# Patient Record
Sex: Male | Born: 1970 | Race: Black or African American | Hispanic: No | Marital: Married | Smoking: Never smoker
Health system: Southern US, Community
[De-identification: ages and names within clinical notes are randomized; demographics above are authoritative.]

## PROBLEM LIST (undated history)

## (undated) DIAGNOSIS — I10 Essential (primary) hypertension: Secondary | ICD-10-CM

---

## 2000-03-19 ENCOUNTER — Encounter: Payer: Self-pay | Admitting: Emergency Medicine

## 2000-03-19 ENCOUNTER — Emergency Department (HOSPITAL_COMMUNITY): Admission: EM | Admit: 2000-03-19 | Discharge: 2000-03-19 | Payer: Self-pay | Admitting: Emergency Medicine

## 2000-04-11 ENCOUNTER — Emergency Department (HOSPITAL_COMMUNITY): Admission: EM | Admit: 2000-04-11 | Discharge: 2000-04-11 | Payer: Self-pay | Admitting: Emergency Medicine

## 2005-11-26 ENCOUNTER — Emergency Department (HOSPITAL_COMMUNITY): Admission: EM | Admit: 2005-11-26 | Discharge: 2005-11-26 | Payer: Self-pay | Admitting: Emergency Medicine

## 2009-06-18 ENCOUNTER — Emergency Department (HOSPITAL_COMMUNITY): Admission: EM | Admit: 2009-06-18 | Discharge: 2009-06-18 | Payer: Self-pay | Admitting: Emergency Medicine

## 2009-08-28 ENCOUNTER — Emergency Department (HOSPITAL_COMMUNITY): Admission: EM | Admit: 2009-08-28 | Discharge: 2009-08-28 | Payer: Self-pay | Admitting: Family Medicine

## 2009-12-03 ENCOUNTER — Emergency Department (HOSPITAL_COMMUNITY): Admission: EM | Admit: 2009-12-03 | Discharge: 2009-12-03 | Payer: Self-pay | Admitting: Family Medicine

## 2011-07-27 ENCOUNTER — Ambulatory Visit (HOSPITAL_COMMUNITY)
Admission: RE | Admit: 2011-07-27 | Discharge: 2011-07-27 | Disposition: A | Discharge status: Home / Self Care | Payer: BC Managed Care – PPO | Source: Ambulatory Visit | Attending: Obstetrics and Gynecology | Admitting: Obstetrics and Gynecology

## 2011-07-27 DIAGNOSIS — Z31448 Encounter for other genetic testing of male for procreative management: Secondary | ICD-10-CM | POA: Insufficient documentation

## 2011-07-27 LAB — CBC
Hemoglobin: 14.6 g/dL (ref 13.0–17.0)
MCHC: 33.2 g/dL (ref 30.0–36.0)
Platelets: 142 10*3/uL — ABNORMAL LOW (ref 150–400)
RBC: 4.82 MIL/uL (ref 4.22–5.81)

## 2011-07-27 LAB — FERRITIN: Ferritin: 221 ng/mL (ref 22–322)

## 2011-07-29 LAB — HEMOGLOBINOPATHY EVALUATION
Hgb A2 Quant: 2.8 % (ref 2.2–3.2)
Hgb A: 97.2 % (ref 96.8–97.8)

## 2012-04-20 ENCOUNTER — Encounter (HOSPITAL_COMMUNITY): Payer: Self-pay | Admitting: Emergency Medicine

## 2012-04-20 ENCOUNTER — Emergency Department (HOSPITAL_COMMUNITY)
Admission: EM | Admit: 2012-04-20 | Discharge: 2012-04-20 | Disposition: A | Discharge status: Home / Self Care | Payer: BC Managed Care – PPO | Source: Home / Self Care

## 2012-04-20 DIAGNOSIS — B361 Tinea nigra: Secondary | ICD-10-CM

## 2012-04-20 HISTORY — DX: Essential (primary) hypertension: I10

## 2012-04-20 MED ORDER — HYDROXYZINE HCL 25 MG PO TABS: 25.0000 mg | ORAL_TABLET | Freq: Four times a day (QID) | ORAL | Status: DC | PRN

## 2012-04-20 MED ORDER — TRIAMCINOLONE ACETONIDE 0.1 % EX CREA: TOPICAL_CREAM | Freq: Two times a day (BID) | CUTANEOUS | Status: DC

## 2012-04-20 NOTE — ED Notes (Signed)
Pt c/o rash x1 week... Rash is on arms, chest, abd, and back... Denies: fevers, vomiting, nauseas, diarrhea, chest pain, SOB, blurry vision, edema, headaches... He is alert w/no signs of acute distress.

## 2012-04-20 NOTE — ED Provider Notes (Signed)
History     CSN: [id number]  Arrival date & time 04/20/12  1828   None     Chief Complaint  Patient presents with  . Rash    (Consider location/radiation/quality/duration/timing/severity/associated sxs/prior treatment) HPI Comments: 41 year old male developed an itchy rash approximately one week ago it has since spread to any body surface areas including the upper extremities torso anterior posterior and thighs. He denies associated fever, chills or other systemic symptoms. He has had no contact with chemicals, soaps or detergents or other agents that may have caused an allergic type reaction.   Past Medical History  Diagnosis Date  . Hypertension     History reviewed. No pertinent past surgical history.  No family history on file.  History  Substance Use Topics  . Smoking status: Never Smoker   . Smokeless tobacco: Not on file  . Alcohol Use: No      Review of Systems  Constitutional: Negative.   HENT: Negative.   Respiratory: Negative.   Cardiovascular: Negative.   Skin:       See history of present illness  All other systems reviewed and are negative.    Allergies  Review of patient's allergies indicates no known allergies.  Home Medications   Current Outpatient Rx  Name  Route  Sig  Dispense  Refill  . HYDROXYZINE HCL 25 MG PO TABS   Oral   Take 1 tablet (25 mg total) by mouth every 6 (six) hours as needed for itching.   20 tablet   0   . TRIAMCINOLONE ACETONIDE 0.1 % EX CREA   Topical   Apply topically 2 (two) times daily. Apply for 2 weeks. May use on face   30 g   0     BP 183/118  Pulse 73  Temp 98 F (36.7 C) (Oral)  Resp 18  SpO2 97%  Physical Exam  Constitutional: He is oriented to person, place, and time. He appears well-developed and well-nourished. No distress.  Eyes: Conjunctivae normal and EOM are normal.  Neck: Normal range of motion. Neck supple.  Cardiovascular: Normal rate and normal heart sounds.   Pulmonary/Chest:  Effort normal and breath sounds normal.  Musculoskeletal: Normal range of motion. He exhibits no edema.  Lymphadenopathy:    He has no cervical adenopathy.  Neurological: He is alert and oriented to person, place, and time. He exhibits normal muscle tone.  Skin: Skin is warm and dry.       The rash started as an ovoid scaly lesion on the left upper arm that has spread across the chest and into the right arm and then covering the end tire torso anterior and posterior. The face is spared. The rash on the back has a Christmas tree pattern. Most of the lesions are approximately the same size. They are intensely pruritic  Psychiatric: He has a normal mood and affect.    ED Course  Procedures (including critical care time)  Labs Reviewed - No data to display No results found.   1. Pityriasis nigra       MDM  The rash was closely resembles pityriasis. Second opinion by Dr. Lorenz Coaster in [place] agreed. He has no systemic symptoms such as fever.  The diagnosis was explained instructions given. Atarax 25 mg every 4 hours when necessary itching Triamcinolone cream applied to the lesions for itching. He will also followup with his physician soon for elevated blood pressure. He does have a physician in [facility]. He does not experience headache  or any neurologic symptoms, chest pain or shortness of breath.         Hayden Rasmussen, NP 04/20/12 2029

## 2012-04-21 NOTE — ED Provider Notes (Signed)
Medical screening examination/treatment/procedure(s) were performed by resident physician or non-physician practitioner and as supervising physician I was immediately available for consultation/collaboration.   Barkley Bruns MD.    Linna Hoff, MD 04/21/12 1302

## 2013-05-22 ENCOUNTER — Encounter (HOSPITAL_COMMUNITY): Payer: Self-pay | Admitting: Emergency Medicine

## 2013-05-22 ENCOUNTER — Emergency Department (HOSPITAL_COMMUNITY): Payer: BC Managed Care – PPO

## 2013-05-22 ENCOUNTER — Observation Stay (HOSPITAL_COMMUNITY)
Admission: EM | Admit: 2013-05-22 | Discharge: 2013-05-23 | Disposition: A | Discharge status: Home / Self Care | Payer: BC Managed Care – PPO | Attending: Internal Medicine | Admitting: Internal Medicine

## 2013-05-22 DIAGNOSIS — M549 Dorsalgia, unspecified: Secondary | ICD-10-CM | POA: Diagnosis present

## 2013-05-22 DIAGNOSIS — I160 Hypertensive urgency: Secondary | ICD-10-CM | POA: Diagnosis present

## 2013-05-22 DIAGNOSIS — I10 Essential (primary) hypertension: Principal | ICD-10-CM | POA: Insufficient documentation

## 2013-05-22 DIAGNOSIS — M542 Cervicalgia: Secondary | ICD-10-CM | POA: Insufficient documentation

## 2013-05-22 DIAGNOSIS — R9431 Abnormal electrocardiogram [ECG] [EKG]: Secondary | ICD-10-CM | POA: Insufficient documentation

## 2013-05-22 LAB — CBC WITH DIFFERENTIAL/PLATELET
BASOS ABS: 0 10*3/uL (ref 0.0–0.1)
Basophils Relative: 0 % (ref 0–1)
Eosinophils Absolute: 0.1 10*3/uL (ref 0.0–0.7)
Eosinophils Relative: 2 % (ref 0–5)
HCT: 42.3 % (ref 39.0–52.0)
Hemoglobin: 14.3 g/dL (ref 13.0–17.0)
LYMPHS ABS: 2.4 10*3/uL (ref 0.7–4.0)
LYMPHS PCT: 39 % (ref 12–46)
MCH: 31 pg (ref 26.0–34.0)
MCHC: 33.8 g/dL (ref 30.0–36.0)
MCV: 91.8 fL (ref 78.0–100.0)
Monocytes Absolute: 0.5 10*3/uL (ref 0.1–1.0)
Monocytes Relative: 8 % (ref 3–12)
NEUTROS ABS: 3.2 10*3/uL (ref 1.7–7.7)
Neutrophils Relative %: 51 % (ref 43–77)
PLATELETS: 134 10*3/uL — AB (ref 150–400)
RBC: 4.61 MIL/uL (ref 4.22–5.81)
RDW: 12.7 % (ref 11.5–15.5)
WBC: 6.3 10*3/uL (ref 4.0–10.5)

## 2013-05-22 LAB — TSH: TSH: 4.998 u[IU]/mL — ABNORMAL HIGH (ref 0.350–4.500)

## 2013-05-22 LAB — BASIC METABOLIC PANEL
BUN: 16 mg/dL (ref 6–23)
CHLORIDE: 101 meq/L (ref 96–112)
CO2: 25 meq/L (ref 19–32)
Calcium: 9.2 mg/dL (ref 8.4–10.5)
Creatinine, Ser: 0.91 mg/dL (ref 0.50–1.35)
GFR calc Af Amer: >90 mL/min (ref >90)
GFR calc non Af Amer: >90 mL/min (ref >90)
Glucose, Bld: 101 mg/dL — ABNORMAL HIGH (ref 70–99)
POTASSIUM: 4 meq/L (ref 3.7–5.3)
SODIUM: 140 meq/L (ref 137–147)

## 2013-05-22 LAB — HEPATIC FUNCTION PANEL
ALT: 38 U/L (ref 0–53)
AST: 31 U/L (ref 0–37)
Albumin: 4.2 g/dL (ref 3.5–5.2)
Alkaline Phosphatase: 59 U/L (ref 39–117)
BILIRUBIN TOTAL: 0.5 mg/dL (ref 0.3–1.2)
Total Protein: 7.7 g/dL (ref 6.0–8.3)

## 2013-05-22 LAB — MRSA PCR SCREENING: MRSA BY PCR: NEGATIVE

## 2013-05-22 LAB — TROPONIN I: Troponin I: <0.3 ng/mL (ref <0.30)

## 2013-05-22 LAB — RAPID URINE DRUG SCREEN, HOSP PERFORMED
AMPHETAMINES: NOT DETECTED
Barbiturates: NOT DETECTED
Benzodiazepines: NOT DETECTED
Cocaine: NOT DETECTED
Opiates: NOT DETECTED
TETRAHYDROCANNABINOL: NOT DETECTED

## 2013-05-22 MED ORDER — CYCLOBENZAPRINE HCL 10 MG PO TABS
5.0000 mg | ORAL_TABLET | Freq: Once | ORAL | Status: AC
  Administered 2013-05-22: 5 mg via ORAL
  Filled 2013-05-22: qty 1

## 2013-05-22 MED ORDER — SODIUM CHLORIDE 0.9 % IV SOLN: 250.0000 mL | INTRAVENOUS | Status: DC | PRN

## 2013-05-22 MED ORDER — KETOROLAC TROMETHAMINE 60 MG/2ML IM SOLN
30.0000 mg | Freq: Once | INTRAMUSCULAR | Status: AC
  Administered 2013-05-22: 30 mg via INTRAMUSCULAR
  Filled 2013-05-22: qty 2

## 2013-05-22 MED ORDER — ASPIRIN EC 81 MG PO TBEC
81.0000 mg | DELAYED_RELEASE_TABLET | Freq: Every day | ORAL | Status: DC
  Administered 2013-05-23: 81 mg via ORAL
  Filled 2013-05-22: qty 1

## 2013-05-22 MED ORDER — HYDRALAZINE HCL 20 MG/ML IJ SOLN: 5.0000 mg | INTRAMUSCULAR | Status: DC | PRN

## 2013-05-22 MED ORDER — LABETALOL HCL 5 MG/ML IV SOLN
10.0000 mg | Freq: Once | INTRAVENOUS | Status: AC
  Administered 2013-05-22: 10 mg via INTRAVENOUS
  Filled 2013-05-22: qty 4

## 2013-05-22 MED ORDER — SODIUM CHLORIDE 0.9 % IJ SOLN
3.0000 mL | Freq: Two times a day (BID) | INTRAMUSCULAR | Status: DC
  Administered 2013-05-23: 3 mL via INTRAVENOUS

## 2013-05-22 MED ORDER — HEPARIN SODIUM (PORCINE) 5000 UNIT/ML IJ SOLN
5000.0000 [IU] | Freq: Three times a day (TID) | INTRAMUSCULAR | Status: DC
  Administered 2013-05-22 (×2): 5000 [IU] via SUBCUTANEOUS
  Filled 2013-05-22 (×6): qty 1

## 2013-05-22 MED ORDER — HYDROMORPHONE HCL PF 1 MG/ML IJ SOLN
1.0000 mg | Freq: Once | INTRAMUSCULAR | Status: AC
  Administered 2013-05-22: 1 mg via INTRAMUSCULAR
  Filled 2013-05-22: qty 1

## 2013-05-22 MED ORDER — CYCLOBENZAPRINE HCL 5 MG PO TABS
7.5000 mg | ORAL_TABLET | Freq: Three times a day (TID) | ORAL | Status: DC
  Administered 2013-05-22 – 2013-05-23 (×3): 7.5 mg via ORAL
  Filled 2013-05-22 (×9): qty 1.5

## 2013-05-22 MED ORDER — MORPHINE SULFATE 2 MG/ML IJ SOLN: 1.0000 mg | INTRAMUSCULAR | Status: DC | PRN

## 2013-05-22 MED ORDER — SODIUM CHLORIDE 0.9 % IJ SOLN: 3.0000 mL | INTRAMUSCULAR | Status: DC | PRN

## 2013-05-22 MED ORDER — SODIUM CHLORIDE 0.9 % IJ SOLN
3.0000 mL | Freq: Two times a day (BID) | INTRAMUSCULAR | Status: DC
  Administered 2013-05-22: 3 mL via INTRAVENOUS

## 2013-05-22 MED ORDER — HYDROCODONE-ACETAMINOPHEN 5-325 MG PO TABS: 2.0000 | ORAL_TABLET | ORAL | Status: AC | PRN

## 2013-05-22 MED ORDER — IBUPROFEN 600 MG PO TABS
600.0000 mg | ORAL_TABLET | Freq: Four times a day (QID) | ORAL | Status: DC | PRN
  Filled 2013-05-22: qty 1

## 2013-05-22 MED ORDER — CLONIDINE HCL 0.2 MG PO TABS: 0.2000 mg | ORAL_TABLET | Freq: Once | ORAL | Status: DC

## 2013-05-22 MED ORDER — HYDRALAZINE HCL 20 MG/ML IJ SOLN
10.0000 mg | Freq: Once | INTRAMUSCULAR | Status: AC
  Administered 2013-05-22: 10 mg via INTRAVENOUS
  Filled 2013-05-22: qty 1

## 2013-05-22 MED ORDER — ASPIRIN EC 325 MG PO TBEC
325.0000 mg | DELAYED_RELEASE_TABLET | Freq: Once | ORAL | Status: AC
Start: 2013-05-22 — End: 2013-05-22
  Administered 2013-05-22: 325 mg via ORAL
  Filled 2013-05-22: qty 1

## 2013-05-22 MED ORDER — DIAZEPAM 5 MG PO TABS: 5.0000 mg | ORAL_TABLET | Freq: Four times a day (QID) | ORAL | Status: DC | PRN

## 2013-05-22 MED ORDER — HYDROCHLOROTHIAZIDE 25 MG PO TABS
25.0000 mg | ORAL_TABLET | Freq: Every day | ORAL | Status: DC
  Administered 2013-05-22 – 2013-05-23 (×2): 25 mg via ORAL
  Filled 2013-05-22 (×2): qty 1

## 2013-05-22 NOTE — ED Notes (Signed)
C/o, mid to upper back and neck pain, pain worse with movement, pinpoints pain to b/w shoulder blades, L>R, (denies: known injury, fall, radiation, numbness, tingling, arm or hand pain, cough, congestion, cold sx, fever, sob, CP, nvd, dizziness, weakness, dropping things or other sx), pt does custodial work, "looking down aggravates pain". Grip strength and coordination are equal and strong. CMS & ROM intact.

## 2013-05-22 NOTE — ED Provider Notes (Signed)
CSN: [id number]     Arrival date & time 05/22/13  [id number] History   First MD Initiated Contact with Patient 05/22/13 [id number]     Chief Complaint  Patient presents with  . Back Pain   (Consider location/radiation/quality/duration/timing/severity/associated sxs/prior Treatment) HPI Comments: 43 yo male with HTN hx presents with left posterior shoulder and neck pain, worsen with movement, pt is not taking any bp meds at this time.  Pain is left trapezius.  He is a [employer], no recent injuries.  Pt has had this in the past.  No cardiac hx.  No cp or sob.  No ha.  No herniation or neck issues known. Severe ache, improved with position. -  Patient is a 43 y.o. male presenting with back pain. The history is provided by the patient.  Back Pain Associated symptoms: no abdominal pain, no chest pain, no dysuria, no fever and no headaches     Past Medical History  Diagnosis Date  . Hypertension    History reviewed. No pertinent past surgical history. No family history on file. History  Substance Use Topics  . Smoking status: Never Smoker   . Smokeless tobacco: Not on file  . Alcohol Use: No    Review of Systems  Constitutional: Negative for fever and chills.  Eyes: Negative for visual disturbance.  Respiratory: Negative for shortness of breath.   Cardiovascular: Negative for chest pain.  Gastrointestinal: Negative for vomiting and abdominal pain.  Genitourinary: Negative for dysuria and flank pain.  Musculoskeletal: Positive for back pain and neck pain. Negative for neck stiffness.  Skin: Negative for rash.  Neurological: Negative for light-headedness and headaches.    Allergies  Review of patient's allergies indicates no known allergies.  Home Medications  No current outpatient prescriptions on file. BP 202/126  Pulse 75  Temp(Src) 98.8 F (37.1 C) (Oral)  Resp 18  SpO2 95% Physical Exam  Nursing note and vitals reviewed. Constitutional: He appears well-developed and  well-nourished.  HENT:  Head: Normocephalic and atraumatic.  Eyes: Right eye exhibits no discharge. Left eye exhibits no discharge.  Neck: Normal range of motion. Neck supple. No tracheal deviation present.  Cardiovascular: Normal rate, regular rhythm and intact distal pulses.   Pulmonary/Chest: Effort normal and breath sounds normal.  Abdominal: Soft. There is no tenderness.  Musculoskeletal: He exhibits tenderness. He exhibits no edema.  Very tender and tight musculature left trapezius, worse with movement Normal strength/ sensation in UE with f/e at major joints, left paraspinal cervical tender, no midline  Neurological: He is alert. No cranial nerve deficit.  Skin: Skin is warm. No rash noted.  Psychiatric: He has a normal mood and affect.    ED Course  Procedures (including critical care time) Labs Review Labs Reviewed  CBC WITH DIFFERENTIAL  BASIC METABOLIC PANEL  TROPONIN I   Imaging Review No results found.  EKG Interpretation    Date/Time:  Wednesday May 22 2013 07:13:17 EST Ventricular Rate:  65 PR Interval:  175 QRS Duration: 115 QT Interval:  391 QTC Calculation: 406 R Axis:     Text Interpretation:  Age not entered, assumed to be  43 years old for purpose of ECG interpretation Sinus rhythm Incomplete right bundle branch block ST elev, probable normal early repol pattern Confirmed by Cletis Clack  MD, Khaidyn Staebell ([id number]) on 05/22/2013 7:22:22 AM            MDM   1. HTN (hypertension)   2. Strain of left trapezius muscle    Clinically MSK  strain, possible nerve impingement.  No cp or sob, low risk cardiac and clinically other cause. EKG done due to htn for which he is not on meds, partially pain and partial htn hx. Stressed close fup with pcp for bp control.   IM pain meds and muscle relaxant given.  BP not improving.   EKG showed likely early repol, no olds.  Cardiac evaluation/ labs added. Labetalol, CXR, bilateral bp arms.   Discussed plan with pt, if  no improvement and pending workup he may need CT chest and/ or admission. Signed out to continue evaluation.  Filed Vitals:   05/22/13 0349 05/22/13 0714  BP: 202/126 218/133  Pulse: 75 66  Temp: 98.8 F (37.1 C)   TempSrc: Oral   Resp: 18 18  SpO2: 95% 100%    Results and differential diagnosis were discussed with the patient. Close follow up outpatient was discussed, patient comfortable with the plan.   Diagnosis: HTN, Left trapezius strain, Abnormal EKG    Enid Skeens, MD 05/22/13 [phone]

## 2013-05-22 NOTE — ED Provider Notes (Signed)
Patient presented to the ER initially for pain in the left upper back region. He was noted to be profoundly hypertensive on arrival. Patient does have history of hypertension, reports that he stopped taking the medication because of stomach upset. Is not sure he stopped, has been off medicines for some time.  Patient's examination reveals tenderness of the left trapezius muscle region, back pain seems to be musculoskeletal, however cardiac etiology of the shoulder pain was also considered. EKG was grossly abnormal with early repolarization and inverted T waves in the inferior leads. No clear ischemia, but this is possible. This point, however, was negative.  Patient was initially treated with labetalol. He became more bradycardic, but had only minimal response. The patient was therefore administered hydralazine. Based on his refractory hypertension, abnormal EKG, he will be admitted for further management.  Gilda Creasehristopher J. Maya Scholer, MD 05/22/13 0930

## 2013-05-22 NOTE — H&P (Signed)
Hospital Admission Note Date: 05/22/2013  Patient name: Daniel Padilla Medical record number: [id number] Date of birth: December 09, 1970 Age: 43 y.o. Gender: male PCP: No primary provider on file.  Medical Service: IMTS  Attending physician: Dr. Kem Kays  Internal Medicine Teaching Service Contact Information  Weekday Hours (7AM-5PM):  1st Contact:  Daivd Council (Medical Student): Pgr: [phone]  1st Contact:  Dr. Aundria Rud            Pager: [phone] 2nd Contact: Dr.  Shirlee Latch           Pager:   [phone]   ** If no return call within 15 minutes (after trying both pagers listed above), please call after hours pagers.   After 5 pm or weekends: 1st Contact: Pager: [phone] 2nd Contact: Pager: [phone]  Chief Complaint: Back pain and elevated blood pressure.  History of Present Illness:  Patient is a 43 year old man with past medical history of hypertension, who presents with  back pain and elevated blood pressure.   Patient reports that he started having back pain in the early morning. His back pain is located at the left upper back, mainly in the shoulder blade area. It is aching, 10 out of 10 in severity, persistent with a intermittent fluctuation, non radiating. It is aggravated by turning the head to the right or bending. It is alleviated by the resting. Patient does not have weakness, numbness or decreased sensations over the left shoulder or arm. Of note, patient had a similar episode in the last year. He  was seen by urgent care and was given some pain medications which did not help. His pain gradually resolved spontaneously. 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 Patient was treated with one dose of Dilaudid, and 30 mg of IV ketorolac in ED with some improvement.   Patient was found to have significant elevated blood pressure with SBP of 220 initially. EKG showed inverted T wave in lead 3 and aVF and questionable ST elevation only in V2. He  denies any chest pain. The patient was treated with 5 mg of hydralazine IV, and 10 mg of IV labetalol. His blood pressure was 181/104 mmHg when I saw patient. She does not have blurry vision, headache, weakness, numbness, or decreased sensations in his extremities. Patient was very sleepy which he  attributes to poor sleeping due to back pain.   ROS:  Denies fever, chills, fatigue, headaches, cough, chest pain, SOB,  abdominal pain, diarrhea, constipation, dysuria, urgency, frequency, hematuria or leg swelling.  Meds: Current Outpatient Rx  Name  Route  Sig  Dispense  Refill  . diazepam (VALIUM) 5 MG tablet   Oral   Take 1 tablet (5 mg total) by mouth every 6 (six) hours as needed for muscle spasms (spasms).   8 tablet   0   . HYDROcodone-acetaminophen (NORCO) 5-325 MG per tablet   Oral   Take 2 tablets by mouth every 4 (four) hours as needed.   10 tablet   0     Allergies: Allergies as of 05/22/2013  . (No Known Allergies)   Past Medical History: none  Diagnosis Date  . Hypertension    Family history: Mother has "stomach problem", father died of unknown reason at age of 33s, has 3 brothers and 5 sisters who are all healthy.  History   Social History  . Marital Status: Married    Spouse Name: N/A    Number of Children: N/A  .  Years of Education: N/A   Occupational History  . Not on file.   Social History Main Topics  . Smoking status: Never Smoker   . Smokeless tobacco: Not on file  . Alcohol Use: No  . Drug Use: No  . Sexual Activity:    Other Topics Concern  . Not on file   Social History Narrative  .  married, living [place], has 2 daughters, works for [employer], doing maintenance work, denies smoking, drinking alcohol or using drugs. No history of recent long distance traveling     Review of Systems: Full 14-point review of systems otherwise negative except as noted above in HPI.  Physical Exam:   Filed Vitals:   05/22/13 0815 05/22/13  0830 05/22/13 0844 05/22/13 0845  BP: 183/115 180/124 180/124 178/110  Pulse: 57 82  52  Temp:      TempSrc:      Resp:  18  17  SpO2: 97% 100%  97%    General: Not in acute distress HEENT: PERRL, EOMI, no scleral icterus, No JVD or bruit. Sleepy.  Cardiac: S1/S2, RRR, No murmurs, gallops or rubs Pulm: Good air movement bilaterally. Clear to auscultation bilaterally. No rales, wheezing, rhonchi or rubs. Abd: Soft, nondistended, nontender, no rebound pain, no organomegaly, BS present Ext: No edema. 2+DP/PT pulse bilaterally Musculoskeletal: Tender and tight musculature over left upper back and trapezius muscle region, worse upon turning head to the right. No tenderness over the midline of the spine. There is no tenderness over the left shoulder joint. Muscle strength and sensation are normal over left upper extremity.  Skin: No rashes.  Neuro: Alert and oriented X3, cranial nerves II-XII grossly intact, muscle strength 5/5 in all extremeties, sensation to light touch intact. Brachial reflex 2+ bilaterally. Psych: Patient is not psychotic, no suicidal or hemocidal ideation. Psychiatric: He has a normal mood and affect.   Lab results: Basic Metabolic Panel:  Recent Labs  84/13/2401/21/15 0734  NA 140  K 4.0  CL 101  CO2 25  GLUCOSE 101*  BUN 16  CREATININE 0.91  CALCIUM 9.2   Liver Function Tests: No results found for this basename: AST, ALT, ALKPHOS, BILITOT, PROT, ALBUMIN,  in the last 72 hours No results found for this basename: LIPASE, AMYLASE,  in the last 72 hours No results found for this basename: AMMONIA,  in the last 72 hours CBC:  Recent Labs  05/22/13 0734  WBC 6.3  NEUTROABS 3.2  HGB 14.3  HCT 42.3  MCV 91.8  PLT 134*   Cardiac Enzymes:  Recent Labs  05/22/13 0734  TROPONINI <0.30   BNP: No results found for this basename: PROBNP,  in the last 72 hours D-Dimer: No results found for this basename: DDIMER,  in the last 72 hours CBG: No results found for  this basename: GLUCAP,  in the last 72 hours Hemoglobin A1C: No results found for this basename: HGBA1C,  in the last 72 hours Fasting Lipid Panel: No results found for this basename: CHOL, HDL, LDLCALC, TRIG, CHOLHDL, LDLDIRECT,  in the last 72 hours Thyroid Function Tests: No results found for this basename: TSH, T4TOTAL, FREET4, T3FREE, THYROIDAB,  in the last 72 hours Anemia Panel: No results found for this basename: VITAMINB12, FOLATE, FERRITIN, TIBC, IRON, RETICCTPCT,  in the last 72 hours Coagulation: No results found for this basename: LABPROT, INR,  in the last 72 hours Urine Drug Screen: Drugs of Abuse  No results found for this basename:  labopia,  cocainscrnur,  labbenz,  amphetmu,  thcu,  labbarb    Alcohol Level: No results found for this basename: ETH,  in the last 72 hours Urinalysis: No results found for this basename: COLORURINE, APPERANCEUR, LABSPEC, PHURINE, GLUCOSEU, HGBUR, BILIRUBINUR, KETONESUR, PROTEINUR, UROBILINOGEN, NITRITE, LEUKOCYTESUR,  in the last 72 hours Misc. Labs:  Imaging results:  Dg Chest 2 View  05/22/2013   CLINICAL DATA:  Chest pain.  EXAM: CHEST  2 VIEW  COMPARISON:  PA and lateral chest 08/28/2009.  FINDINGS: Lungs are clear. Heart size is normal. No pneumothorax or pleural effusion.  IMPRESSION: Negative chest.   Electronically Signed   By: Drusilla Kanner M.D.   On: 05/22/2013 07:55    Other results:  Assessment & Plan by Problem:  43 year old man with a past medical history of hypertension and medication noncompliance, who presents with back pain and elevated blood pressure. Electrolytes normal, no leukocytosis, chest x-ray negative. EKG shows T wave inversion in inferior leads, but no chest pain.   #: Hypertensive urgency: It is most likely due to medication  noncompliance. Patient could not recall what medication he was taking at home. He has not been taking his blood pressure medication for more than 3 months. Patient presents with  significantly elevated blood pressure with SBP >220 mmHg without signs of end organ damage or lab findings. He has T wave inversion in inferior leads, but no any chest pain. He has ST elevation , but only in V2 lead. His initial troponin test was negative. The best diagnosis would be hypertensive urgency. His blood pressure should be lowered gradually. After initial treatment in the ED, his blood pressure has been trending down (most recent BP is 160/100 mmHg at 11:21 AM).  Patient was very sleepy which he attributes to poor sleeping due to back pain. This needs to be observed closely, if worsening, may need to get MRI to r/o hypertensive encephalopathy.   -will admit to tele bed for observation -will start HCTZ 25 mmHg daily -hydralazine 5 mg iv prn for SBP>185 mmHg. -check TSH, UDS, and Lipid profil -trop q6h X 3  #: Back pain: It is most likely due to musculoskeletal pain. 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 There is no alarming symptoms, such as weakness, numbness or decreased sensation in his arms. There is no tenderness over the shoulder joint. Symptoms improved slightly after treated with Flexeril and IV Toradol in Ed. -will continue Flexeril 7.5 mg tid -Start ibuprofen 600 mg q6h prn  #  F/E/N  -SL: -Electrolytes: fine on admission -Diet: Heart health diet  # DVT px: Heparin sq    Dispo: Disposition is deferred at this time, awaiting improvement of current medical problems. Anticipated discharge in approximately 1 to 2 day(s).   The patient does not have a current PCP (No primary provider on file.), therefore is not requiring OPC follow-up after discharge.   The patient does not have transportation limitations that hinder transportation to clinic appointments.  Signed:  Lorretta Harp, MD PGY3, Internal Medicine Teaching Service Pager: [phone]  05/22/2013, 8:54 AM

## 2013-05-22 NOTE — ED Notes (Signed)
Patient transported to X-ray 

## 2013-05-22 NOTE — ED Notes (Signed)
Pt. reports upper back pain onset yesterday morning , denies injury or fall , respirations unlabored . Hypertensive at triage . Pt. stated he is not taking any antihypertensive medications.

## 2013-05-23 DIAGNOSIS — I10 Essential (primary) hypertension: Secondary | ICD-10-CM

## 2013-05-23 DIAGNOSIS — M549 Dorsalgia, unspecified: Secondary | ICD-10-CM

## 2013-05-23 LAB — BASIC METABOLIC PANEL
BUN: 18 mg/dL (ref 6–23)
BUN: 16 mg/dL (ref 6–23)
CHLORIDE: 100 meq/L (ref 96–112)
CHLORIDE: 99 meq/L (ref 96–112)
CO2: 27 mEq/L (ref 19–32)
CO2: 27 meq/L (ref 19–32)
Calcium: 9.1 mg/dL (ref 8.4–10.5)
Calcium: 9.5 mg/dL (ref 8.4–10.5)
Creatinine, Ser: 1.19 mg/dL (ref 0.50–1.35)
Creatinine, Ser: 1.02 mg/dL (ref 0.50–1.35)
GFR calc Af Amer: >90 mL/min (ref >90)
GFR calc non Af Amer: 90 mL/min — ABNORMAL LOW (ref >90)
GFR, EST AFRICAN AMERICAN: 86 mL/min — AB (ref >90)
GFR, EST NON AFRICAN AMERICAN: 74 mL/min — AB (ref >90)
Glucose, Bld: 96 mg/dL (ref 70–99)
Glucose, Bld: 85 mg/dL (ref 70–99)
Potassium: 4.3 mEq/L (ref 3.7–5.3)
Potassium: 4 mEq/L (ref 3.7–5.3)
Sodium: 140 mEq/L (ref 137–147)
Sodium: 138 mEq/L (ref 137–147)

## 2013-05-23 LAB — LIPID PANEL
CHOLESTEROL: 244 mg/dL — AB (ref 0–200)
HDL: 61 mg/dL (ref >39)
LDL Cholesterol: 159 mg/dL — ABNORMAL HIGH (ref 0–99)
Total CHOL/HDL Ratio: 4 RATIO
Triglycerides: 119 mg/dL (ref <150)
VLDL: 24 mg/dL (ref 0–40)

## 2013-05-23 MED ORDER — HYDROCHLOROTHIAZIDE 25 MG PO TABS: 25.0000 mg | ORAL_TABLET | Freq: Every day | ORAL | Status: DC

## 2013-05-23 MED ORDER — TRAMADOL HCL 50 MG PO TABS: 50.0000 mg | ORAL_TABLET | Freq: Two times a day (BID) | ORAL | Status: AC | PRN

## 2013-05-23 MED ORDER — CYCLOBENZAPRINE HCL 10 MG PO TABS: 5.0000 mg | ORAL_TABLET | Freq: Two times a day (BID) | ORAL | Status: DC | PRN

## 2013-05-23 MED ORDER — CYCLOBENZAPRINE HCL 5 MG PO TABS: 5.0000 mg | ORAL_TABLET | Freq: Every evening | ORAL | Status: AC | PRN

## 2013-05-23 MED ORDER — ACETAMINOPHEN 325 MG PO TABS: 650.0000 mg | ORAL_TABLET | Freq: Four times a day (QID) | ORAL | Status: DC | PRN

## 2013-05-23 MED ORDER — ACETAMINOPHEN 325 MG PO TABS
650.0000 mg | ORAL_TABLET | Freq: Once | ORAL | Status: AC
  Administered 2013-05-23: 650 mg via ORAL
  Filled 2013-05-23: qty 2

## 2013-05-23 NOTE — Discharge Instructions (Signed)
Please take Tylenol (which you can buy over the counter) 650 mg every 6 hours as needed for pain for the next 3-4 days.  We are giving you a prescription for a medicine called Tramadol to use only as needed for breakthrough pain.  We are also giving you a prescription for the same muscle relaxant you were taking in the hospital to take ONLY AT NIGHT as needed.   You should also purchase a heating pad to use for pain as well.    Please also take your blood pressure medicine called hydrochlorothiazide every day.  This is VERY IMPORTANT!  Don't forget [facility].  They have you on the cancellation list for an earlier appointment time.   Hypertension As your heart beats, it forces blood through your arteries. This force is your blood pressure. If the pressure is too high, it is called hypertension (HTN) or high blood pressure. HTN is dangerous because you may have it and not know it. High blood pressure may mean that your heart has to work harder to pump blood. Your arteries may be narrow or stiff. The extra work puts you at risk for heart disease, stroke, and other problems.  Blood pressure consists of two numbers, a higher number over a lower, 110/72, for example. It is stated as "110 over 72." The ideal is below 120 for the top number (systolic) and under 80 for the bottom (diastolic). Write down your blood pressure today. You should pay close attention to your blood pressure if you have certain conditions such as:  Heart failure.  Prior heart attack.  Diabetes  Chronic kidney disease.  Prior stroke.  Multiple risk factors for heart disease. To see if you have HTN, your blood pressure should be measured while you are seated with your arm held at the level of the heart. It should be measured at least twice. A one-time elevated blood pressure reading (especially in the Emergency Department) does not mean that you need treatment. There may  be conditions in which the blood pressure is different between your right and left arms. It is important to see your caregiver soon for a recheck. Most people have essential hypertension which means that there is not a specific cause. This type of high blood pressure may be lowered by changing lifestyle factors such as:  Stress.  Smoking.  Lack of exercise.  Excessive weight.  Drug/tobacco/alcohol use.  Eating less salt. Most people do not have symptoms from high blood pressure until it has caused damage to the body. Effective treatment can often prevent, delay or reduce that damage. TREATMENT  When a cause has been identified, treatment for high blood pressure is directed at the cause. There are a large number of medications to treat HTN. These fall into several categories, and your caregiver will help you select the medicines that are best for you. Medications may have side effects. You should review side effects with your caregiver. If your blood pressure stays high after you have made lifestyle changes or started on medicines,   Your medication(s) may need to be changed.  Other problems may need to be addressed.  Be certain you understand your prescriptions, and know how and when to take your medicine.  Be sure to follow up with your caregiver within the time frame advised (usually within two weeks) to have your blood pressure rechecked and to review your medications.  If you are taking more than one medicine to  lower your blood pressure, make sure you know how and at what times they should be taken. Taking two medicines at the same time can result in blood pressure that is too low. SEEK IMMEDIATE MEDICAL CARE IF:  You develop a severe headache, blurred or changing vision, or confusion.  You have unusual weakness or numbness, or a faint feeling.  You have severe chest or abdominal pain, vomiting, or breathing problems. MAKE SURE YOU:   Understand these instructions.  Will  watch your condition.  Will get help right away if you are not doing well or get worse. Document Released: 04/18/2005 Document Revised: 07/11/2011 Document Reviewed: 12/07/2007 [facility] Patient Information 2014 [place], [place].  Muscle Strain A muscle strain (pulled muscle) happens when a muscle is stretched beyond normal length. It happens when a sudden, violent force stretches your muscle too far. Usually, a few of the fibers in your muscle are torn. Muscle strain is common in athletes. Recovery usually takes 1 2 weeks. Complete healing takes 5 6 weeks.  HOME CARE   Follow the PRICE method of treatment to help your injury get better. Do this the first 2 3 days after the injury:  Protect. Protect the muscle to keep it from getting injured again.  Rest. Limit your activity and rest the injured body part.  Ice. Put ice in a plastic bag. Place a towel between your skin and the bag. Then, apply the ice and leave it on from 15 20 minutes each hour. After the third day, switch to moist heat packs.  Compression. Use a splint or elastic bandage on the injured area for comfort. Do not put it on too tightly.  Elevate. Keep the injured body part above the level of your heart.  Only take medicine as told by your doctor.  Warm up before doing exercise to prevent future muscle strains. GET HELP IF:   You have more pain or puffiness (swelling) in the injured area.  You feel numbness, tingling, or notice a loss of strength in the injured area. MAKE SURE YOU:   Understand these instructions.  Will watch your condition.  Will get help right away if you are not doing well or get worse. Document Released: 01/26/2008 Document Revised: 02/06/2013 Document Reviewed: 11/15/2012 [facility] Patient Information 2014 [place], [place].

## 2013-05-23 NOTE — Progress Notes (Signed)
Discharge instructions given to patient with teach-back.  All questions answered.  No complaints. 

## 2013-05-23 NOTE — Progress Notes (Signed)
Subjective: [name] is doing better this morning, states his pain is improved.  He did not ask for any ibuprofen overnight.  No chest pain.  Objective: Vital signs in last 24 hours: Filed Vitals:   05/23/13 0745 05/23/13 0746 05/23/13 0747 05/23/13 0748  BP: 139/94     Pulse: 59 61 62 61  Temp:      TempSrc:      Resp: 14 14 12 12   Height:      Weight:      SpO2: 99% 98% 98% 98%   Weight change:   Intake/Output Summary (Last 24 hours) at 05/23/13 0854 Last data filed at 05/23/13 0838  Gross per 24 hour  Intake    195 ml  Output   1400 ml  Net  -1205 ml   PEX General: alert, cooperative, NAD HEENT: NCAT, vision grossly intact Neck: supple, no lymphadenopathy Lungs: clear to ascultation bilaterally, normal work of respiration, no wheezes, rales, ronchi Heart: regular rate and rhythm, no murmurs, gallops, or rubs Back: pain reproducible with palpation over shoulder blade Abdomen: soft, non-tender, non-distended, normal bowel sounds Extremities: 2+ DP/PT pulses bilaterally, no cyanosis, clubbing, or edema Neurologic: alert & oriented X3, cranial nerves II-XII intact, strength grossly intact, sensation intact to light touch  Lab Results: Basic Metabolic Panel:  Recent Labs Lab 05/22/13 0734 05/23/13 0320  NA 140 140  K 4.0 4.3  CL 101 100  CO2 25 27  GLUCOSE 101* 96  BUN 16 18  CREATININE 0.91 1.19  CALCIUM 9.2 9.1   Liver Function Tests:  Recent Labs Lab 05/22/13 1000  AST 31  ALT 38  ALKPHOS 59  BILITOT 0.5  PROT 7.7  ALBUMIN 4.2   CBC:  Recent Labs Lab 05/22/13 0734  WBC 6.3  NEUTROABS 3.2  HGB 14.3  HCT 42.3  MCV 91.8  PLT 134*   Cardiac Enzymes:  Recent Labs Lab 05/22/13 0734 05/22/13 1000 05/22/13 1525  TROPONINI <0.30 <0.30 <0.30   Fasting Lipid Panel:  Recent Labs Lab 05/23/13 0320  CHOL 244*  HDL 61  LDLCALC 159*  TRIG 119  CHOLHDL 4.0   Thyroid Function Tests:  Recent Labs Lab 05/22/13 1000  TSH 4.998*     Urine Drug Screen: Drugs of Abuse     Component Value Date/Time   LABOPIA NONE DETECTED 05/22/2013 1010   COCAINSCRNUR NONE DETECTED 05/22/2013 1010   LABBENZ NONE DETECTED 05/22/2013 1010   AMPHETMU NONE DETECTED 05/22/2013 1010   THCU NONE DETECTED 05/22/2013 1010   LABBARB NONE DETECTED 05/22/2013 1010    Micro Results: Recent Results (from the past 240 hour(s))  MRSA PCR SCREENING     Status: None   Collection Time    05/22/13 12:15 PM      Result Value Range Status   MRSA by PCR NEGATIVE  NEGATIVE Final   Comment:            The GeneXpert MRSA Assay (FDA     approved for NASAL specimens     only), is one component of a     comprehensive MRSA colonization     surveillance program. It is not     intended to diagnose MRSA     infection nor to guide or     monitor treatment for     MRSA infections.   Studies/Results: Dg Chest 2 View  05/22/2013   CLINICAL DATA:  Chest pain.  EXAM: CHEST  2 VIEW  COMPARISON:  PA and lateral  chest 08/28/2009.  FINDINGS: Lungs are clear. Heart size is normal. No pneumothorax or pleural effusion.  IMPRESSION: Negative chest.   Electronically Signed   By: Drusilla Kanner M.D.   On: 05/22/2013 07:55   Medications: I have reviewed the patient's current medications. Scheduled Meds: . aspirin EC  81 mg Oral Daily  . cyclobenzaprine  7.5 mg Oral Q8H  . heparin  5,000 Units Subcutaneous Q8H  . hydrochlorothiazide  25 mg Oral Daily  . sodium chloride  3 mL Intravenous Q12H  . sodium chloride  3 mL Intravenous Q12H   Continuous Infusions:  PRN Meds:.sodium chloride, hydrALAZINE, sodium chloride Assessment/Plan: # Hypertensive urgency- BP 139/94 this morning, 140s/90s since yesterday afternoon.  Patient's BP elevated to 220s systolic yesterday in ED likely due to medication noncompliance for >7 months as well as pain component.  Pain now better controlled and BP controlled on HCTZ 25 mg daily alone.  No evidence of end organ damage although Cr trended  up to 1.19 (from 0.95 yesterday).  Therefore, will repeat BMP at 11am (to ensure down-trending prior to discharge) and hold NSAID therapy (patient did receive Toradol IV 30 mg yesterday).  EKG findings of T wave inversion in inferior leads and mild ST elevation in V2 on admission, but patient repeatedly denies chest pain, 2 repeat EKGs with stable findings, troponins x 3 negative.  Aortic dissection was even considered given complaint of back pain but no tearing sensation, CXR negative, BP checked in both arms which showed difference in systolic BP of 10 (<20 which is criteria for dissection).  UDS negative.  Lipid profile revealed LDL of 159, and patient was previously on statin therapy; however, based on ASCVD risk score of 6.1%, he does not meet criteria for statin.  -continue HCTZ 25 mmHg daily  -close outpatient follow-up   # Back pain- Very likely MSK etiology.  On exam, pain is reproducible with palpation; no red flags.   Symptoms improved with Flexeril.  Patient did not take any ibuprofen overnight and has not applied heat to affected area.  -Flexeril 5 mg BID while inpatient -acetominophen 650 mg q6h prn  -heat to affected area  Dispo: Disposition is deferred at this time, awaiting improvement of current medical problems.  Anticipated discharge today pending repeat BMP at 11a.   The patient does have a current PCP Jackie Plum, MD) and does need an [facility] hospital follow-up appointment after discharge.  .Services Needed at time of discharge: Y = Yes, Blank = No PT:   OT:   RN:   Equipment:   Other:     LOS: 1 day   Rocco Serene, MD 05/23/2013, 8:54 AM

## 2013-05-23 NOTE — Discharge Summary (Signed)
Name: Daniel Padilla MRN: [id number] DOB: 12/26/1970 43 y.o. PCP: Jackie PlumGeorge Osei-Bonsu, MD  Date of Admission: 05/22/2013  4:54 AM Date of Discharge: 05/23/2013 Attending Physician: Dr. Kem KaysPaya  Discharge Diagnosis:  Principal Problem:   Hypertensive urgency Active Problems:   Back pain  Discharge Medications:   Medication List         cyclobenzaprine 5 MG tablet  Commonly known as:  FLEXERIL  Take 1 tablet (5 mg total) by mouth at bedtime as needed for muscle spasms.     hydrochlorothiazide 25 MG tablet  Commonly known as:  HYDRODIURIL  Take 1 tablet (25 mg total) by mouth daily.     HYDROcodone-acetaminophen 5-325 MG per tablet  Commonly known as:  NORCO  Take 2 tablets by mouth every 4 (four) hours as needed.     traMADol 50 MG tablet  Commonly known as:  ULTRAM  Take 1 tablet (50 mg total) by mouth every 12 (twelve) hours as needed.        Disposition and follow-up:   [name] St[facility].  At the hospital follow up visit please address:  1.  BP control   2.  Resolution of left upper back pain   3.  Labs / imaging needed at time of follow-up: none  4.  Pending labs/ test needing follow-up: none  Follow-up Appointments: Follow-up Information   Follow up with [facility]    . Call on 07/02/2013. (at 11:30am, patient at top of wait list for appointment cancellations so may be able to be seen at an earlier date)    Contact information:   [address] [place] [id number] [phone]      Discharge Instructions: Discharge Orders   Future Appointments Provider Department Dept Phone   07/02/2013 11:30 AM Doris Cheadleeepak Advani, MD [facility] [phone]   Future Orders Complete By Expires   Call MD for:  severe uncontrolled pain  As directed    Diet - low sodium heart healthy  As directed    Increase activity slowly  As directed         Consultations:  none  Procedures Performed:  Dg Chest 2 View  05/22/2013   CLINICAL DATA:  Chest pain.  EXAM: CHEST  2 VIEW  COMPARISON:  PA and lateral chest 08/28/2009.  FINDINGS: Lungs are clear. Heart size is normal. No pneumothorax or pleural effusion.  IMPRESSION: Negative chest.   Electronically Signed   By: Drusilla Kannerhomas  Dalessio M.D.   On: 05/22/2013 07:55    Admission HPI:  Patient is a 43 year old man with past medical history of hypertension, who presents with back pain and elevated blood pressure.  Patient reports that he started having back pain in the early morning. His back pain is located at the left upper back, mainly in the shoulder blade area. It is aching, 10 out of 10 in severity, persistent with a intermittent fluctuation, non radiating. It is aggravated by turning the head to the right or bending. It is alleviated by the resting. Patient does not have weakness, numbness or decreased sensations over the left shoulder or arm. Of note, patient had a similar episode in the last year. He was seen by urgent care and was given some pain medications which did not help. His pain gradually resolved spontaneously. 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 Patient was treated  with one dose of Dilaudid, and 30 mg of IV ketorolac in ED with some improvement.  Patient was found to have significant elevated blood pressure with SBP of 220 initially. EKG showed inverted T wave in lead 3 and aVF and questionable ST elevation only in V2. He denies any chest pain. The patient was treated with 5 mg of hydralazine IV, and 10 mg of IV labetalol. His blood pressure was 181/104 mmHg when I saw patient. She does not have blurry vision, headache, weakness, numbness, or decreased sensations in his extremities. Patient was very sleepy which he attributes to poor sleeping due to back pain.    Hospital Course by problem list: 1. Hypertensive urgency- Patient's  BP elevated to 220s systolic on presentation likely due to medication noncompliance for >7 months as well as pain component (see below).  (Per pharmacy, patient had one month prescriptions for HCTZ 25 mg daily, amlodipine 10 mg daily, losartan 50 mg daily, and statin which he filled in 08/2012 only.)  In ED, he was given labetolol 10 mg IV, hydralazine 10 mg IV, and clonidine 0.2 mg PO.  Pain now better controlled and BP controlled on HCTZ 25 mg daily alone; BP 139/94 on morning of discharge, 140s/90s since afternoon prior.  No evidence of end organ damage although Cr trended up to 1.19 (from 0.95 on admission).  Therefore, BMP was repeated and NSAIDs held.  Repeat BMP showed Cr of 1.02; transient elevation possibly due to Toradol patient received in ED for pain (see below).  UDS negative.  Lipid profile revealed LDL of 159, and patient was previously on statin therapy; however, based on ASCVD risk score of 6.1%, he does not meet criteria for statin.  Continued HCTZ 25 mg daily at discharge with close outpatient follow-up.  Provided note for work.   2. Back pain- Very likely MSK etiology.  On exam, pain is reproducible with palpation, no red flags.  In ED, patient was given Dilaudid 1 mg IV, Toradol IV 30 mg, Flexeril 7.5 mg.  Symptoms improved with Flexeril overnight; patient did not request any prn ibuprofen.  Of note, patient had EKG findings of T wave inversion in inferior leads and mild ST elevation in V2 on admission, but he repeatedly denies chest pain, 2 repeat EKGs showed stable findings, troponins x 3 negative.  Aortic dissection was even considered given complaint of back pain but no tearing sensation, CXR negative, BP checked in both arms which showed difference in systolic BP of 10 (<20 which is criteria for dissection).  Discharged with  instructions to take acetaminophen 650 mg q6h for the next several days along with Tramadol 50 mg q12h prn #10 for breakthrough pain, and Flexeril 5 mg qhs prn #10;  he should also apply heat to affected area.    3. Elevated TSH- 4.99.  Free T3, T4 pending.  Follow-up outpatient.    Discharge Vitals:   BP 139/94  Pulse 61  Temp(Src) 98.4 F (36.9 C) (Oral)  Resp 12  Ht 6' (1.829 m)  Wt 184 lb 1.4 oz (83.5 kg)  BMI 24.96 kg/m2  SpO2 98%  Discharge Labs:  Results for orders placed during the hospital encounter of 05/22/13 (from the past 24 hour(s))  BASIC METABOLIC PANEL     Status: Abnormal   Collection Time    05/23/13 12:27 PM      Result Value Range   Sodium 138  137 - 147 mEq/L   Potassium 4.0  3.7 - 5.3 mEq/L  Chloride 99  96 - 112 mEq/L   CO2 27  19 - 32 mEq/L   Glucose, Bld 85  70 - 99 mg/dL   BUN 16  6 - 23 mg/dL   Creatinine, Ser 9.60  0.50 - 1.35 mg/dL   Calcium 9.5  8.4 - 45.4 mg/dL   GFR calc non Af Amer 90 (*) >90 mL/min   GFR calc Af Amer >90  >90 mL/min    Signed: Rocco Serene, MD 05/24/2013, 12:21 PM   Time Spent on Discharge: 40 minutes Services Ordered on Discharge: none Equipment Ordered on Discharge: none

## 2013-05-23 NOTE — H&P (Signed)
INTERNAL MEDICINE TEACHING SERVICE Attending Admission Note  Date: 05/23/2013  Patient name: Daniel Padilla  Medical record number: [id number]  Date of birth: 10/19/1970    I have seen and evaluated Daniel Padilla and discussed their care with the Residency Team.  43 yr old male with a pmhx significant for HTN, presented with back pain. He states his pain woke him up from sleep and was located in the upper left back. He admitted it would worsen with position. He admits he does some heavy lifting at work. He denied CP, SOB, dizziness. In the ED, he was noted to have a SBP of 220 mmHg with upper back pain. He was treated with IV toradol as well as Dilaudid. EKG showed NSR without definite ST changes. He was treated with IV labetalol and IV hydralazine with improvement of SBP to 181 mmHg. On exam, his pulses are equal bilaterally. He does not have significant difference in BP in both arms. He has tenderness over left upper trapezius muscle, worse with movement of the head to the right. He has no neurological deficits. Cardiac exam is S1S2, no m/r/g, RRR. Pulmonary exam is CTA bilat.CXR without acute findings, no widened mediastinum. Trop neg x 3. At this time, he has no been on BP medications for months. Agree with outpatient tx with HCTZ 25 mg po daily as he has responded to this therapy. He has upper trapezius spasm, agree with shourt course of flexeril and acetaminophen. His Cr did rise slightly post toradol tx, but this has improved on repeat BMP. No AKI. Medically stable for D/C. Needs PCP.  Jonah BlueAlejandro Tiaira Arambula, DO, FACP Faculty [facility] 05/23/2013, 2:49 PM

## 2013-05-24 NOTE — Discharge Summary (Signed)
  Date: 05/24/2013  Patient name: Daniel Padilla  Medical record number: [id number]  Date of birth: 06/12/1970   This patient has been seen and the plan of care was discussed with the house staff. Please see their note for complete details. I concur with their findings and plan.  Jonah BlueAlejandro Irl Bodie, DO, FACP Faculty [facility] 05/24/2013, 5:22 PM

## 2013-07-02 ENCOUNTER — Ambulatory Visit: Payer: BC Managed Care – PPO | Admitting: Internal Medicine

## 2014-12-08 ENCOUNTER — Emergency Department (HOSPITAL_COMMUNITY)
Admission: EM | Admit: 2014-12-08 | Discharge: 2014-12-08 | Disposition: A | Discharge status: Home / Self Care | Payer: BLUE CROSS/BLUE SHIELD | Source: Home / Self Care | Attending: Family Medicine | Admitting: Family Medicine

## 2014-12-08 ENCOUNTER — Encounter (HOSPITAL_COMMUNITY): Payer: Self-pay | Admitting: Emergency Medicine

## 2014-12-08 DIAGNOSIS — I10 Essential (primary) hypertension: Secondary | ICD-10-CM | POA: Diagnosis not present

## 2014-12-08 MED ORDER — HYDROCHLOROTHIAZIDE 25 MG PO TABS: 25.0000 mg | ORAL_TABLET | Freq: Every day | ORAL | Status: AC

## 2014-12-08 MED ORDER — AMLODIPINE BESYLATE 10 MG PO TABS
10.0000 mg | ORAL_TABLET | Freq: Every day | ORAL | Status: AC
Start: 2014-12-08 — End: ?

## 2014-12-08 NOTE — ED Notes (Signed)
C/o high blood pressure, history of the same.  Patient has been out of blood pressure medicine.  Patient was seen by knee specialist today and told blood pressure is elevated and to come to ucc for evaluation.

## 2014-12-08 NOTE — ED Provider Notes (Signed)
CSN: [id number]     Arrival date & time 12/08/14  1904 History   First MD Initiated Contact with Patient 12/08/14 1942     Chief Complaint  Patient presents with  . Hypertension   (Consider location/radiation/quality/duration/timing/severity/associated sxs/prior Treatment) Patient is a 44 y.o. male presenting with hypertension.  Hypertension This is a chronic problem. The current episode started 6 to 12 hours ago (seen by an md today re right knee and told bp high and needs med, pt not taking for awhile). The problem has been gradually worsening. Pertinent negatives include no chest pain, no headaches and no shortness of breath.    Past Medical History  Diagnosis Date  . Hypertension    History reviewed. No pertinent past surgical history. No family history on file. History  Substance Use Topics  . Smoking status: Never Smoker   . Smokeless tobacco: Not on file  . Alcohol Use: No    Review of Systems  Constitutional: Negative.   Eyes: Negative for pain and visual disturbance.  Respiratory: Negative for chest tightness and shortness of breath.   Cardiovascular: Negative for chest pain, palpitations and leg swelling.  Neurological: Negative for headaches.    Allergies  Review of patient's allergies indicates no known allergies.  Home Medications   Prior to Admission medications   Medication Sig Start Date End Date Taking? Authorizing Provider  amLODipine (NORVASC) 10 MG tablet Take 1 tablet (10 mg total) by mouth daily. 12/08/14   Linna Hoff, MD  cyclobenzaprine (FLEXERIL) 5 MG tablet Take 1 tablet (5 mg total) by mouth at bedtime as needed for muscle spasms. 05/23/13   Leanora Cover, MD  hydrochlorothiazide (HYDRODIURIL) 25 MG tablet Take 1 tablet (25 mg total) by mouth daily. 12/08/14   Linna Hoff, MD  HYDROcodone-acetaminophen (NORCO) 5-325 MG per tablet Take 2 tablets by mouth every 4 (four) hours as needed. 05/22/13   Blane Ohara, MD  traMADol (ULTRAM) 50 MG tablet  Take 1 tablet (50 mg total) by mouth every 12 (twelve) hours as needed. 05/23/13   Leanora Cover, MD   BP 160/110 mmHg  Pulse 68  Temp(Src) 98.4 F (36.9 C) (Oral)  Resp 16  SpO2 98% Physical Exam  Constitutional: He is oriented to person, place, and time. He appears well-developed and well-nourished. No distress.  Neck: Normal range of motion. Neck supple.  Cardiovascular: Normal rate, regular rhythm, normal heart sounds and intact distal pulses.   Pulmonary/Chest: Effort normal and breath sounds normal.  Lymphadenopathy:    He has no cervical adenopathy.  Neurological: He is alert and oriented to person, place, and time.  Skin: Skin is warm and dry.  Nursing note and vitals reviewed.   ED Course  Procedures (including critical care time) Labs Review Labs Reviewed - No data to display  Imaging Review No results found.   MDM   1. Essential hypertension        Linna Hoff, MD 12/08/14 2005

## 2014-12-08 NOTE — Discharge Instructions (Signed)
Take your medication as prescribed and see your doctor as soon as possible for recheck

## 2016-06-22 ENCOUNTER — Emergency Department (HOSPITAL_COMMUNITY): Payer: BLUE CROSS/BLUE SHIELD

## 2016-06-22 ENCOUNTER — Encounter (HOSPITAL_COMMUNITY): Payer: Self-pay | Admitting: Emergency Medicine

## 2016-06-22 ENCOUNTER — Emergency Department (HOSPITAL_COMMUNITY)
Admission: EM | Admit: 2016-06-22 | Discharge: 2016-06-22 | Disposition: A | Discharge status: Home / Self Care | Payer: BLUE CROSS/BLUE SHIELD | Attending: Emergency Medicine | Admitting: Emergency Medicine

## 2016-06-22 DIAGNOSIS — R531 Weakness: Secondary | ICD-10-CM | POA: Diagnosis present

## 2016-06-22 DIAGNOSIS — J111 Influenza due to unidentified influenza virus with other respiratory manifestations: Secondary | ICD-10-CM | POA: Diagnosis not present

## 2016-06-22 DIAGNOSIS — I10 Essential (primary) hypertension: Secondary | ICD-10-CM | POA: Insufficient documentation

## 2016-06-22 DIAGNOSIS — R69 Illness, unspecified: Secondary | ICD-10-CM

## 2016-06-22 MED ORDER — DM-GUAIFENESIN ER 30-600 MG PO TB12: 1.0000 | ORAL_TABLET | Freq: Two times a day (BID) | ORAL | 0 refills | Status: AC | PRN

## 2016-06-22 MED ORDER — FEXOFENADINE-PSEUDOEPHED ER 60-120 MG PO TB12: 1.0000 | ORAL_TABLET | Freq: Two times a day (BID) | ORAL | 0 refills | Status: AC

## 2016-06-22 MED ORDER — ACETAMINOPHEN 500 MG PO TABS
1000.0000 mg | ORAL_TABLET | Freq: Once | ORAL | Status: AC
  Administered 2016-06-22: 1000 mg via ORAL
  Filled 2016-06-22: qty 2

## 2016-06-22 NOTE — ED Triage Notes (Signed)
Patient states he has flu like symptoms x 1 day.    Patient states it hurts to cough, some SOB, and sore throat.  He has a headache.  Has tried OTC medication but no relief.

## 2016-06-22 NOTE — ED Notes (Signed)
Discharge instructions, follow up care, and rx x2 reviewed with patient. Patient verbalized understanding. 

## 2016-06-22 NOTE — ED Provider Notes (Signed)
[facility]-EMERGENCY DEPT Provider Note   CSN: [id number] Arrival date & time: 06/22/16  1441     History   Chief Complaint Chief Complaint  Patient presents with  . Weakness  . Fatigue    HPI Daniel Padilla is a 46 y.o. male.  The history is provided by the patient.  Fever   This is a new problem. The current episode started yesterday. The problem occurs constantly. The problem has not changed since onset.The maximum temperature noted was 102 to 102.9 F. Associated symptoms include muscle aches and cough. Pertinent negatives include no diarrhea and no vomiting. Treatments tried: naproxen. The treatment provided no relief.    Past Medical History:  Diagnosis Date  . Hypertension     Patient Active Problem List   Diagnosis Date Noted  . Hypertensive urgency 05/22/2013  . Back pain 05/22/2013  . Hypertension     History reviewed. No pertinent surgical history.     Home Medications    Prior to Admission medications   Medication Sig Start Date End Date Taking? Authorizing Provider  amLODipine (NORVASC) 10 MG tablet Take 1 tablet (10 mg total) by mouth daily. 12/08/14   Linna HoffJames D Kindl, MD  cyclobenzaprine (FLEXERIL) 5 MG tablet Take 1 tablet (5 mg total) by mouth at bedtime as needed for muscle spasms. 05/23/13   Leanora CoverMorgan E Rogers, MD  hydrochlorothiazide (HYDRODIURIL) 25 MG tablet Take 1 tablet (25 mg total) by mouth daily. 12/08/14   Linna HoffJames D Kindl, MD  HYDROcodone-acetaminophen (NORCO) 5-325 MG per tablet Take 2 tablets by mouth every 4 (four) hours as needed. 05/22/13   Blane OharaJoshua Zavitz, MD  traMADol (ULTRAM) 50 MG tablet Take 1 tablet (50 mg total) by mouth every 12 (twelve) hours as needed. 05/23/13   Leanora CoverMorgan E Rogers, MD    Family History No family history on file.  Social History Social History  Substance Use Topics  . Smoking status: Never Smoker  . Smokeless tobacco: Never Used  . Alcohol use No     Allergies   Patient has no known allergies.   Review of  Systems Review of Systems  Constitutional: Positive for fever.  Respiratory: Positive for cough.   Gastrointestinal: Negative for diarrhea and vomiting.  All other systems reviewed and are negative.    Physical Exam Updated Vital Signs BP 140/89 (BP Location: Left Arm)   Pulse 98   Temp 102.6 F (39.2 C) (Oral)   Resp 16   Ht 6\' 1"  (1.854 m)   Wt 222 lb (100.7 kg)   SpO2 99%   BMI 29.29 kg/m   Physical Exam  Constitutional: He is oriented to person, place, and time. He appears well-developed and well-nourished. No distress.  HENT:  Head: Normocephalic and atraumatic.  Nose: Nose normal.  Eyes: Conjunctivae are normal.  Neck: Neck supple. No tracheal deviation present.  Cardiovascular: Normal rate, regular rhythm and normal heart sounds.   Pulmonary/Chest: Effort normal and breath sounds normal. No respiratory distress.  Abdominal: Soft. He exhibits no distension. There is no tenderness. There is no rebound and no guarding.  Neurological: He is alert and oriented to person, place, and time.  Skin: Skin is warm and dry.  Psychiatric: He has a normal mood and affect.  Vitals reviewed.    ED Treatments / Results  Labs (all labs ordered are listed, but only abnormal results are displayed) Labs Reviewed - No data to display  EKG  EKG Interpretation None       Radiology Dg Chest  2 View  Result Date: 06/22/2016 CLINICAL DATA:  Cough and fever for the past week. History of hypertension. Nonsmoker. EXAM: CHEST  2 VIEW COMPARISON:  PA and lateral chest x-ray of May 22, 2013 FINDINGS: The lungs are mildly hypoinflated but clear. The heart and pulmonary vascularity are normal. The mediastinum is normal in width. There is no pleural effusion. The bony thorax exhibits no acute abnormality. IMPRESSION: There is no pneumonia nor other acute cardiopulmonary abnormality. Electronically Signed   By: David  [place] M.D.   On: 06/22/2016 16:23    Procedures Procedures  (including critical care time)  Medications Ordered in ED Medications  acetaminophen (TYLENOL) tablet 1,000 mg (1,000 mg Oral Given 06/22/16 1607)     Initial Impression / Assessment and Plan / ED Course  I have reviewed the triage vital signs and the nursing notes.  Pertinent labs & imaging results that were available during my care of the patient were reviewed by me and considered in my medical decision making (see chart for details).     46 y.o. male presents with body aches, fever, and cough over the last 3 days which worsened today. Discussed tamiflu and Pt declined given side effect profile and is low risk for complications. Supportive care measures administered and discussed. No CXR evidence of pneumonia. VSS except for fever and well appearing. Plan to follow up with PCP as needed and return precautions discussed for worsening or new concerning symptoms.   Final Clinical Impressions(s) / ED Diagnoses   Final diagnoses:  Influenza-like illness    New Prescriptions Discharge Medication List as of 06/22/2016  4:30 PM    START taking these medications   Details  dextromethorphan-guaiFENesin (MUCINEX DM) 30-600 MG 12hr tablet Take 1 tablet by mouth 2 (two) times daily as needed for cough., Starting Wed 06/22/2016, Until Sat 07/02/2016, Print    fexofenadine-pseudoephedrine (ALLEGRA-D) 60-120 MG 12 hr tablet Take 1 tablet by mouth every 12 (twelve) hours., Starting Wed 06/22/2016, Until Sat 07/02/2016, Print         Lyndal Pulley, MD 06/23/16 [phone]

## 2017-08-10 IMAGING — CR DG CHEST 2V
2 series · 2 of 2 positions shown · non-contrast
Comparison: PA and lateral chest x-ray May 22, 2013

CLINICAL DATA: Cough and fever for the past week. History of
hypertension. Nonsmoker.

EXAM:
CHEST  2 VIEW

[w chest pa]
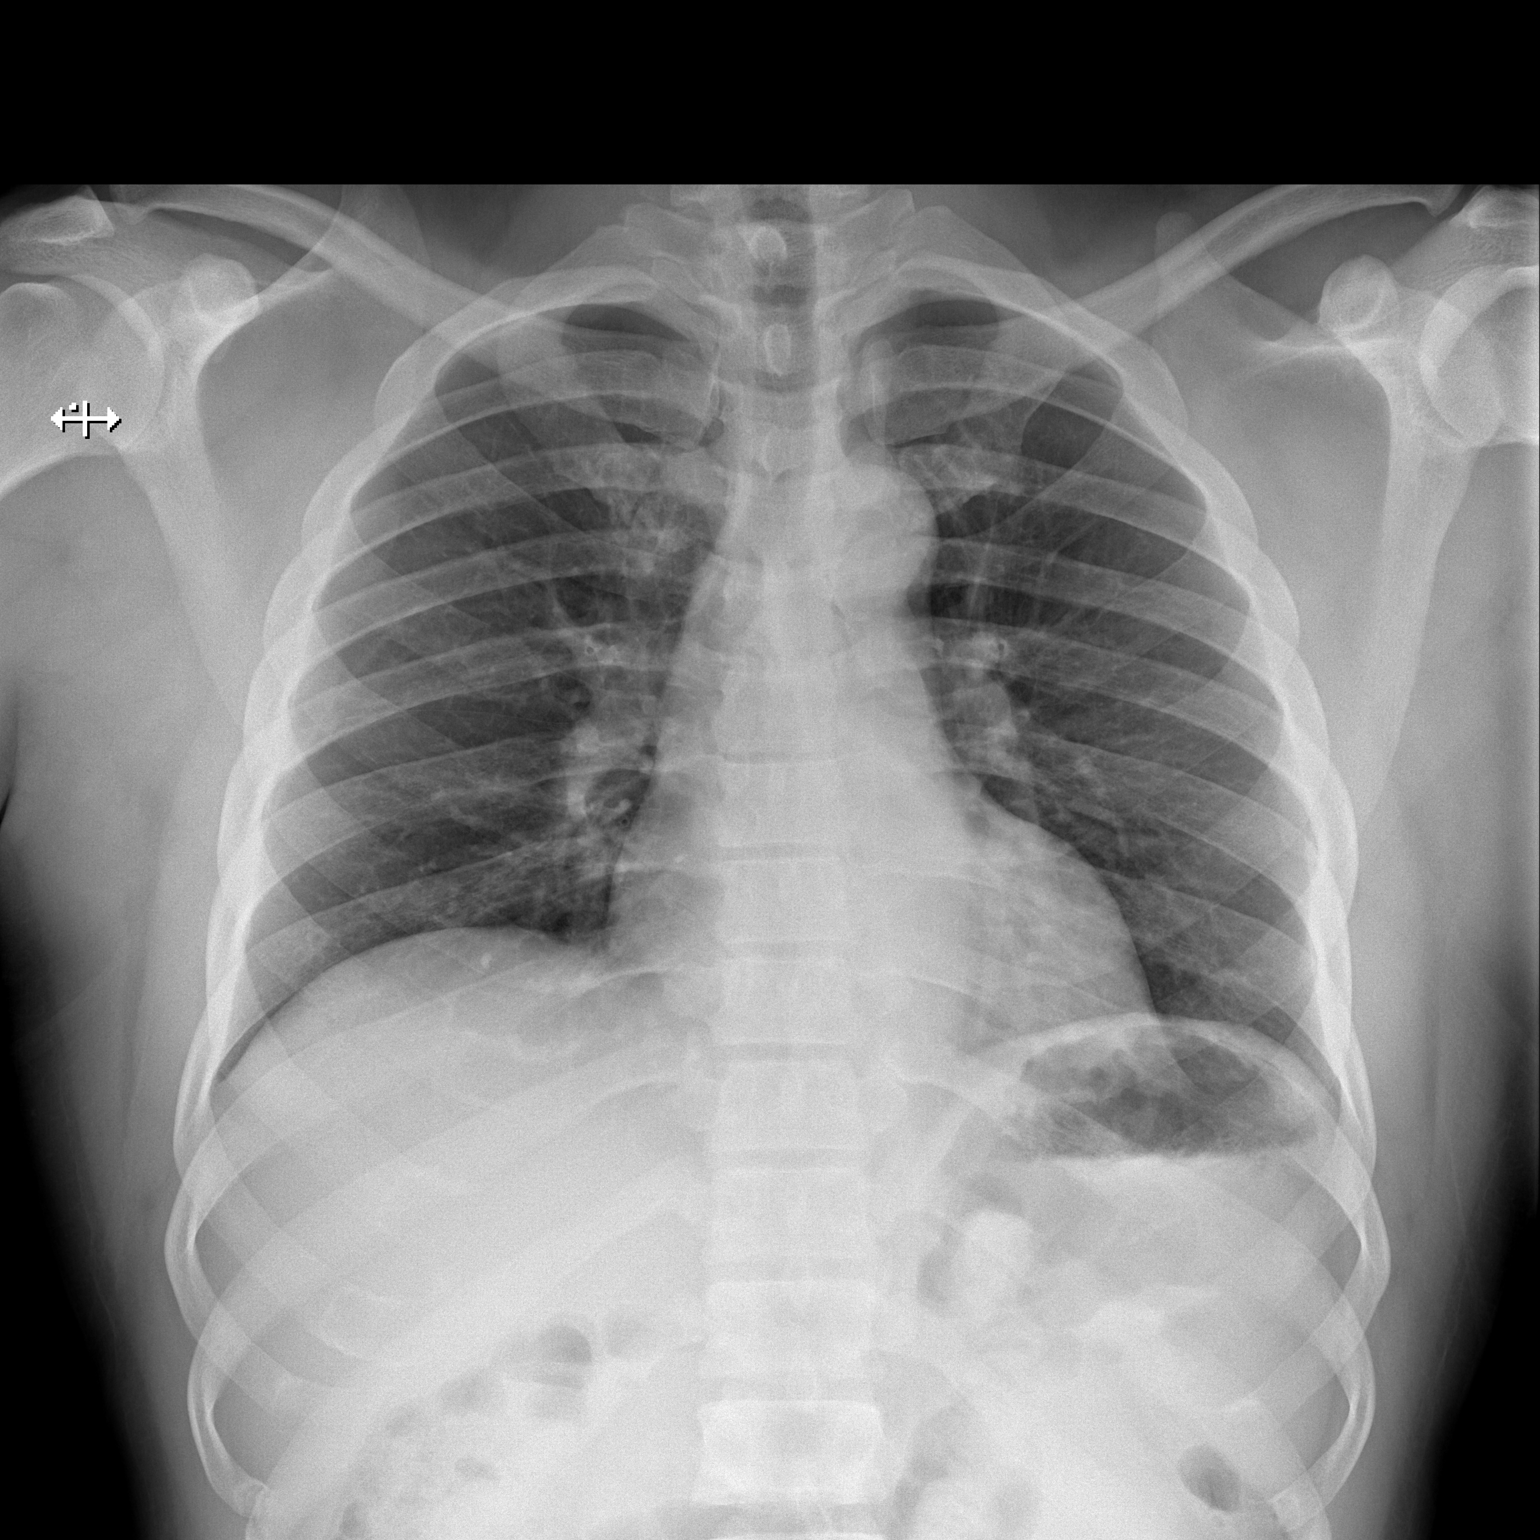

[w chest lat]
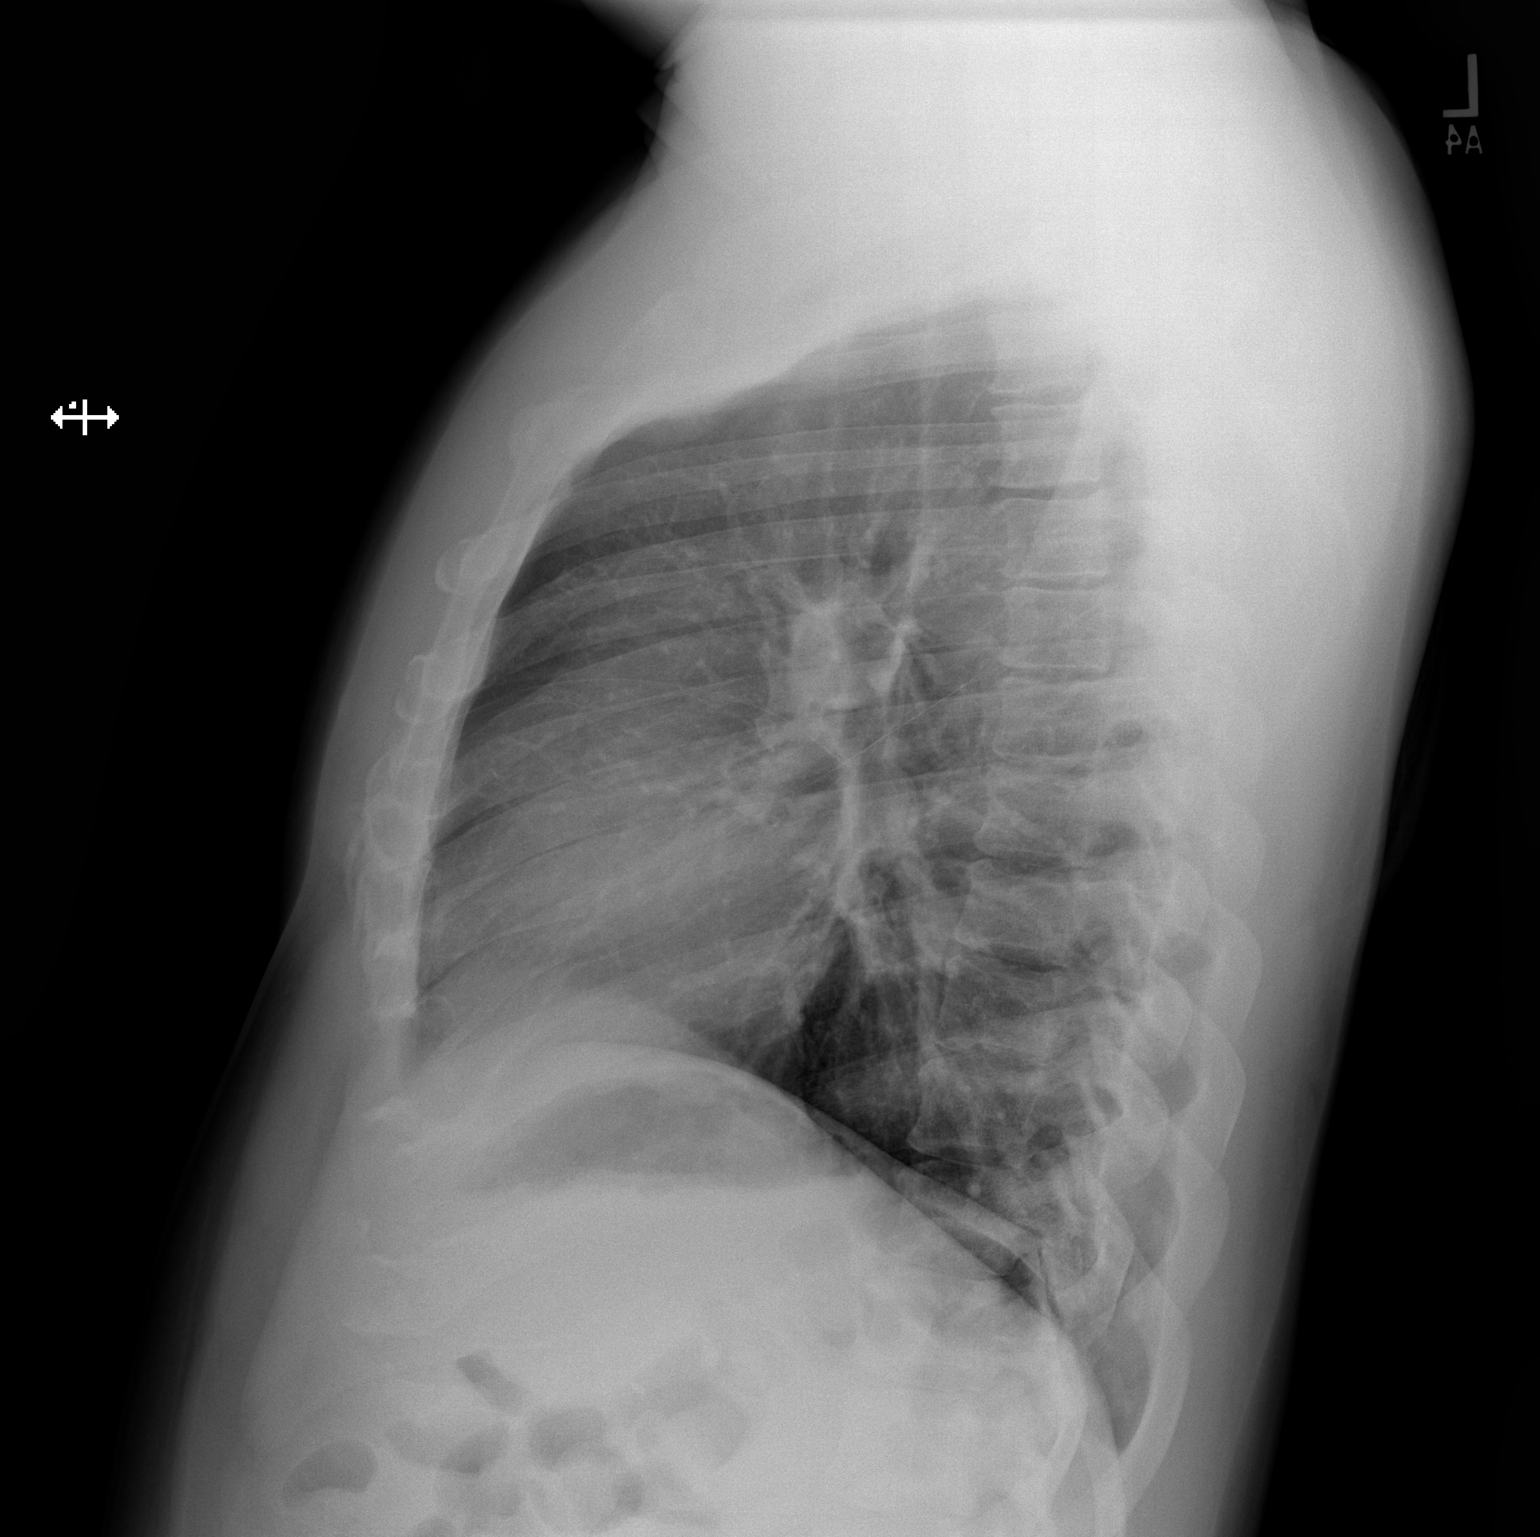

[2 of 2 positions shown; findings below may reference images not displayed]

FINDINGS: The lungs are mildly hypoinflated but clear. The heart and pulmonary
vascularity are normal. The mediastinum is normal in width. There is
no pleural effusion. The bony thorax exhibits no acute abnormality.
IMPRESSION: There is no pneumonia nor other acute cardiopulmonary abnormality.

## 2024-01-26 ENCOUNTER — Encounter: Payer: Self-pay | Admitting: Pulmonary Disease

## 2024-03-22 ENCOUNTER — Encounter: Payer: Self-pay | Admitting: Internal Medicine
